# Patient Record
Sex: Male | Born: 1935 | Race: White | Hispanic: No | Smoking: Never smoker
Health system: Southern US, Community
[De-identification: ages and names within clinical notes are randomized; demographics above are authoritative.]

## PROBLEM LIST (undated history)

## (undated) DIAGNOSIS — I10 Essential (primary) hypertension: Secondary | ICD-10-CM

## (undated) DIAGNOSIS — K219 Gastro-esophageal reflux disease without esophagitis: Secondary | ICD-10-CM

## (undated) DIAGNOSIS — G629 Polyneuropathy, unspecified: Secondary | ICD-10-CM

## (undated) HISTORY — PX: CORONARY ARTERY BYPASS GRAFT: SHX141

---

## 2017-10-08 ENCOUNTER — Emergency Department (HOSPITAL_BASED_OUTPATIENT_CLINIC_OR_DEPARTMENT_OTHER)
Admission: EM | Admit: 2017-10-08 | Discharge: 2017-10-09 | Disposition: A | Discharge status: Other Acute Inpatient Hospital | Payer: Medicare Other | Attending: Emergency Medicine | Admitting: Emergency Medicine

## 2017-10-08 ENCOUNTER — Encounter (HOSPITAL_BASED_OUTPATIENT_CLINIC_OR_DEPARTMENT_OTHER): Payer: Self-pay | Admitting: Emergency Medicine

## 2017-10-08 ENCOUNTER — Emergency Department (HOSPITAL_BASED_OUTPATIENT_CLINIC_OR_DEPARTMENT_OTHER): Payer: Medicare Other

## 2017-10-08 ENCOUNTER — Other Ambulatory Visit: Payer: Self-pay

## 2017-10-08 DIAGNOSIS — Z7982 Long term (current) use of aspirin: Secondary | ICD-10-CM | POA: Diagnosis not present

## 2017-10-08 DIAGNOSIS — R21 Rash and other nonspecific skin eruption: Secondary | ICD-10-CM | POA: Diagnosis present

## 2017-10-08 DIAGNOSIS — I10 Essential (primary) hypertension: Secondary | ICD-10-CM | POA: Insufficient documentation

## 2017-10-08 DIAGNOSIS — N179 Acute kidney failure, unspecified: Secondary | ICD-10-CM | POA: Diagnosis not present

## 2017-10-08 DIAGNOSIS — E871 Hypo-osmolality and hyponatremia: Secondary | ICD-10-CM | POA: Insufficient documentation

## 2017-10-08 DIAGNOSIS — Z79899 Other long term (current) drug therapy: Secondary | ICD-10-CM | POA: Insufficient documentation

## 2017-10-08 DIAGNOSIS — L270 Generalized skin eruption due to drugs and medicaments taken internally: Secondary | ICD-10-CM | POA: Diagnosis not present

## 2017-10-08 HISTORY — DX: Essential (primary) hypertension: I10

## 2017-10-08 HISTORY — DX: Polyneuropathy, unspecified: G62.9

## 2017-10-08 HISTORY — DX: Gastro-esophageal reflux disease without esophagitis: K21.9

## 2017-10-08 LAB — CBC WITH DIFFERENTIAL/PLATELET
BASOS PCT: 0 %
Basophils Absolute: 0 10*3/uL (ref 0.0–0.1)
EOS ABS: 0.2 10*3/uL (ref 0.0–0.7)
Eosinophils Relative: 2 %
HEMATOCRIT: 31.4 % — AB (ref 39.0–52.0)
HEMOGLOBIN: 10.5 g/dL — AB (ref 13.0–17.0)
LYMPHS ABS: 0.5 10*3/uL — AB (ref 0.7–4.0)
Lymphocytes Relative: 7 %
MCH: 31.5 pg (ref 26.0–34.0)
MCHC: 33.4 g/dL (ref 30.0–36.0)
MCV: 94.3 fL (ref 78.0–100.0)
MONOS PCT: 3 %
Monocytes Absolute: 0.3 10*3/uL (ref 0.1–1.0)
NEUTROS ABS: 6.6 10*3/uL (ref 1.7–7.7)
NEUTROS PCT: 88 %
Platelets: 186 10*3/uL (ref 150–400)
RBC: 3.33 MIL/uL — AB (ref 4.22–5.81)
RDW: 14.2 % (ref 11.5–15.5)
WBC: 7.6 10*3/uL (ref 4.0–10.5)

## 2017-10-08 LAB — COMPREHENSIVE METABOLIC PANEL
ALT: 90 U/L — ABNORMAL HIGH (ref 17–63)
ANION GAP: 11 (ref 5–15)
AST: 150 U/L — ABNORMAL HIGH (ref 15–41)
Albumin: 3.7 g/dL (ref 3.5–5.0)
Alkaline Phosphatase: 32 U/L — ABNORMAL LOW (ref 38–126)
BUN: 42 mg/dL — ABNORMAL HIGH (ref 6–20)
CHLORIDE: 99 mmol/L — AB (ref 101–111)
CO2: 15 mmol/L — AB (ref 22–32)
CREATININE: 2.86 mg/dL — AB (ref 0.61–1.24)
Calcium: 8.3 mg/dL — ABNORMAL LOW (ref 8.9–10.3)
GFR, EST AFRICAN AMERICAN: 22 mL/min — AB (ref >60)
GFR, EST NON AFRICAN AMERICAN: 19 mL/min — AB (ref >60)
Glucose, Bld: 93 mg/dL (ref 65–99)
Potassium: 4.4 mmol/L (ref 3.5–5.1)
SODIUM: 125 mmol/L — AB (ref 135–145)
Total Bilirubin: 0.8 mg/dL (ref 0.3–1.2)
Total Protein: 6.8 g/dL (ref 6.5–8.1)

## 2017-10-08 LAB — I-STAT CG4 LACTIC ACID, ED: Lactic Acid, Venous: 1.4 mmol/L (ref 0.5–1.9)

## 2017-10-08 LAB — URINALYSIS, ROUTINE W REFLEX MICROSCOPIC
BILIRUBIN URINE: NEGATIVE
Glucose, UA: NEGATIVE mg/dL
KETONES UR: NEGATIVE mg/dL
LEUKOCYTES UA: NEGATIVE
NITRITE: NEGATIVE
PROTEIN: NEGATIVE mg/dL
Specific Gravity, Urine: 1.025 (ref 1.005–1.030)
pH: 5.5 (ref 5.0–8.0)

## 2017-10-08 LAB — URINALYSIS, MICROSCOPIC (REFLEX): WBC UA: NONE SEEN WBC/hpf (ref 0–5)

## 2017-10-08 LAB — PROTIME-INR
INR: 1.28
PROTHROMBIN TIME: 15.9 s — AB (ref 11.4–15.2)

## 2017-10-08 MED ORDER — SODIUM CHLORIDE 0.9 % IV BOLUS (SEPSIS)
500.0000 mL | Freq: Once | INTRAVENOUS | Status: AC
  Administered 2017-10-08: 500 mL via INTRAVENOUS

## 2017-10-08 MED ORDER — DIPHENHYDRAMINE HCL 50 MG/ML IJ SOLN
25.0000 mg | Freq: Once | INTRAMUSCULAR | Status: AC
Start: 2017-10-08 — End: 2017-10-08
  Administered 2017-10-08: 25 mg via INTRAVENOUS
  Filled 2017-10-08: qty 1

## 2017-10-08 MED ORDER — METHYLPREDNISOLONE SODIUM SUCC 125 MG IJ SOLR
125.0000 mg | Freq: Once | INTRAMUSCULAR | Status: AC
  Administered 2017-10-08: 125 mg via INTRAVENOUS
  Filled 2017-10-08: qty 2

## 2017-10-08 MED ORDER — SODIUM CHLORIDE 0.9 % IV SOLN
Freq: Once | INTRAVENOUS | Status: AC
  Administered 2017-10-08: 20:00:00 via INTRAVENOUS

## 2017-10-08 NOTE — ED Notes (Signed)
Per Dr. Donnald GarrePfeiffer, [facility] will not accept patient and she has [facility].  [facility]ed [facility] via [facility] line [phone] 23:03

## 2017-10-08 NOTE — ED Notes (Signed)
Paged supervisor for [facility] due to no response  21:50

## 2017-10-08 NOTE — ED Notes (Signed)
Per Dr. Donnald GarrePfeiffer [facility] - [facility]

## 2017-10-08 NOTE — ED Notes (Signed)
Paged [facility]ist at [facility] via [facility] per Dr Donnald GarrePfeiffer

## 2017-10-08 NOTE — ED Notes (Signed)
Paged [facility] @ [phone] per request from physician  21:23

## 2017-10-08 NOTE — ED Provider Notes (Signed)
[facility] EMERGENCY DEPARTMENT Provider Note   CSN: [id number] Arrival date & time: 10/08/17  1759     History   Chief Complaint Chief Complaint  Patient presents with  . Tremors    HPI Jonathon Garcia is a 83 y.o. male.  HPI Patient reports he got diagnosed with a urinary tract infection a week ago.  He is taken Bactrim for that.  He reports over the course of the week he has had very poor appetite and has not been able to eat or drink much.  Denies developing a fever.  Reports he has however started to get some chills and developed a rash.  No vomiting or diarrhea have developed.  Rash mostly feels warm and flushed.  He has not noted any blisters on his tongue lips or penis.  His daughter reports she went to take him his dinner this evening and he just seemed like he was getting worse and now has this rash so she brought him to the hospital. Past Medical History:  Diagnosis Date  . GERD (gastroesophageal reflux disease)   . Hypertension   . Peripheral neuropathy     There are no active problems to display for this patient.       Home Medications    Prior to Admission medications   Medication Sig Start Date End Date Taking? Authorizing Provider  amLODipine (NORVASC) 5 MG tablet Take 5 mg by mouth daily.   Yes [provider]  aspirin 325 MG tablet Take 325 mg by mouth daily.   Yes [provider]  atorvastatin (LIPITOR) 80 MG tablet Take 80 mg by mouth daily.   Yes [provider]  fenofibrate 160 MG tablet Take 160 mg by mouth daily.   Yes [provider]  finasteride (PROSCAR) 5 MG tablet Take 5 mg by mouth daily.   Yes [provider]  gabapentin (NEURONTIN) 600 MG tablet Take 600 mg by mouth 3 (three) times daily.   Yes [provider]  hydrochlorothiazide (HYDRODIURIL) 25 MG tablet Take 25 mg by mouth daily.   Yes [provider]  omeprazole (PRILOSEC) 20 MG capsule Take 20 mg by mouth daily.    Yes [provider]  ramipril (ALTACE) 10 MG capsule Take 10 mg by mouth daily.   Yes [provider]  sulfamethoxazole-trimethoprim (BACTRIM DS,SEPTRA DS) 800-160 MG tablet Take 1 tablet by mouth 2 (two) times daily.   Yes [provider]  tamsulosin (FLOMAX) 0.4 MG CAPS capsule Take 0.4 mg by mouth.   Yes [provider]    Family History No family history on file.  Social History Social History   Tobacco Use  . Smoking status: Never Smoker  . Smokeless tobacco: Never Used  Substance Use Topics  . Alcohol use: No    Frequency: Never  . Drug use: No     Allergies   Patient has no known allergies.   Review of Systems Review of Systems 10 Systems reviewed and are negative for acute change except as noted in the HPI.   Physical Exam Updated Vital Signs BP (!) 110/54   Pulse 73   Temp 100 F (37.8 C) (Oral)   Resp 20   Ht 5\' 7"  (1.702 m)   Wt 64.9 kg (143 lb)   SpO2 94%   BMI 22.40 kg/m   Physical Exam  Constitutional: He is oriented to person, place, and time. He appears well-developed and well-nourished.  Patient is alert and interactive.  No respiratory distress.  HENT:  Head: Normocephalic and atraumatic.  Nose: Nose normal.  Mouth/Throat: Oropharynx is clear and moist.  Posterior oropharynx widely patent.  Mucous membranes pink and moist.  No ulcerative lesions on mucous membranes.  Eyes: Conjunctivae and EOM are normal. Pupils are equal, round, and reactive to light.  Neck: Neck supple.  Cardiovascular: Normal rate, regular rhythm, normal heart sounds and intact distal pulses.  No murmur heard. Pulmonary/Chest: Effort normal and breath sounds normal. No respiratory distress.  Abdominal: Soft. He exhibits no distension and no mass. There is no tenderness. There is no guarding.  Genitourinary:  Genitourinary Comments: No ulcerations on penile meatus.  Musculoskeletal: Normal range of motion. He exhibits no edema or  tenderness.  Neurological: He is alert and oriented to person, place, and time. No cranial nerve deficit. He exhibits normal muscle tone. Coordination normal.  Patient has some tremor of the hands with intention movements.  Slight pill-rolling motion (question possible underlying parkinsonian tremor versus other more acute etiology)  Skin: Skin is warm and dry.  Psychiatric: He has a normal mood and affect.  Nursing note and vitals reviewed. \          ED Treatments / Results  Labs (all labs ordered are listed, but only abnormal results are displayed) Labs Reviewed  COMPREHENSIVE METABOLIC PANEL - Abnormal; Notable for the following components:      Result Value   Sodium 125 (*)    Chloride 99 (*)    CO2 15 (*)    BUN 42 (*)    Creatinine, Ser 2.86 (*)    Calcium 8.3 (*)    AST 150 (*)    ALT 90 (*)    Alkaline Phosphatase 32 (*)    GFR calc non Af Amer 19 (*)    GFR calc Af Amer 22 (*)    All other components within normal limits  CBC WITH DIFFERENTIAL/PLATELET - Abnormal; Notable for the following components:   RBC 3.33 (*)    Hemoglobin 10.5 (*)    HCT 31.4 (*)    Lymphs Abs 0.5 (*)    All other components within normal limits  PROTIME-INR - Abnormal; Notable for the following components:   Prothrombin Time 15.9 (*)    All other components within normal limits  URINALYSIS, ROUTINE W REFLEX MICROSCOPIC - Abnormal; Notable for the following components:   Hgb urine dipstick MODERATE (*)    All other components within normal limits  URINALYSIS, MICROSCOPIC (REFLEX) - Abnormal; Notable for the following components:   Bacteria, UA RARE (*)    Squamous Epithelial / LPF 0-5 (*)    All other components within normal limits  URINE CULTURE  CULTURE, BLOOD (ROUTINE X 2)  CULTURE, BLOOD (ROUTINE X 2)  I-STAT CG4 LACTIC ACID, ED    EKG  EKG Interpretation None       Radiology Dg Chest 2 View  Result Date: 10/08/2017 CLINICAL DATA:  Weakness EXAM: CHEST  2 VIEW  COMPARISON:  None. FINDINGS: Cardiac shadow is mildly enlarged. Aortic calcifications are seen. Changes of prior median sternotomy are noted. The lungs are well aerated bilaterally. Calcified granuloma is noted in the left lung base. Visualized upper abdomen demonstrates scattered large small bowel gas. No acute bony abnormality is seen. IMPRESSION: Changes of prior granulomatous disease.  No acute abnormality seen. Electronically Signed   By: Alcide CleverMark  Lukens M.D.   On: 10/08/2017 20:02    Procedures Procedures (including critical care time)  Medications Ordered in  ED Medications  famotidine (PEPCID) IVPB 20 mg premix (not administered)  diphenhydrAMINE (BENADRYL) injection 25 mg (not administered)  methylPREDNISolone sodium succinate (SOLU-MEDROL) 125 mg/2 mL injection 125 mg (not administered)  sodium chloride 0.9 % bolus 500 mL (0 mLs Intravenous Stopped 10/08/17 2032)  0.9 %  sodium chloride infusion ( Intravenous New Bag/Given 10/08/17 2002)  methylPREDNISolone sodium succinate (SOLU-MEDROL) 125 mg/2 mL injection 125 mg (125 mg Intravenous Given 10/08/17 2030)  diphenhydrAMINE (BENADRYL) injection 25 mg (25 mg Intravenous Given 10/08/17 2029)  sodium chloride 0.9 % bolus 500 mL (0 mLs Intravenous Stopped 10/08/17 2236)     Initial Impression / Assessment and Plan / ED Course  I have reviewed the triage vital signs and the nursing notes.  Pertinent labs & imaging results that were available during my care of the patient were reviewed by me and considered in my medical decision making (see chart for details).    Consult: Reviewed with Dr. Erskine SquibbKonbrebbi at [facility]. No bed availability at facility.   Consult: Reviewed with Dr. Antionette Charpyd [facility] hospitalist.  He felt that the patient could not be [facility] due to possibility of Stevens-Johnson's and the patient was more appropriate [facility].  Consult: Discussed with Dr. Ninfa LindenAhmad, hospitalist at [facility]. He advised  they have [facility] open beds [facility] until possibly sometime tomorrow.  Reviewed the case and suggested at this time patient does not have active Stevens-Johnson's without mucosal lesions and appropriate management will be admission at facility and subsequent transfer if patient's condition worsened and became consistent with active Stevens-Johnson's.  Consult: Re-contacted Dr. Antionette Charpyd for [facility] as there was bed [facility].  He advised he refused to accept the patient.  Refused stating there is no available consultations for dermatology for rash and if the patient progressed to Stevens-Johnson syndrome, it would be too difficult for the service to manage. I tried to impress upon him that the patient would indefinitely [facility] and that for patient comfort and safety, best management would be inpatient [facility] with subsequent transfer if his condition deteriorated. He did not agree and expressed personal frustration with my firm belief and reiteration that best management was admission to hospitalist service. He maintained refusal to admit and ended the conversation abruptly by disconnecting the line.  Consult: Reviewed with Dr.Firas Rabbat, hospitalist at [facility].  We reviewed the case.  He accepts the patient for admission.  He does however advised there may be no beds available until after 12 PM tomorrow.  Agrees with current management with continuation of fluids, Solu-Medrol and Benadryl.  No other empiric antibiotics at this time based on her current lack of focus of infection.  Final Clinical Impressions(s) / ED Diagnoses   Final diagnoses:  Hyponatremia  Acute renal failure, unspecified acute renal failure type (HCC)  Rash, drug   Presents as outlined above.  Treatment initiated in the emergency department with IV fluid hydration Solu-Medrol and Benadryl.  Patient showed improvement with hydration and medications.  Facial rash is  diminished.  Patient has no respiratory distress.  As per outlined above ultimate plan is for [facility] however patient will have prolonged ED stay while awaiting bed availability.  Treatment will be to continue normal saline for hyponatremia and AK I.  Continue scheduled Solu-Medrol, Benadryl and Pepcid.  Continue to observe for either improvement or worsening of rash and any change in disposition as needed.  Dr. Judd Lien will observe the patient overnight.  ED Discharge Orders    None       Arby Barrette, MD 10/09/17 [phone]

## 2017-10-08 NOTE — ED Triage Notes (Signed)
Patient states that he was put on sulfa last week for a possible UTI - today he started to have shakes and tremors "chils" with a rash

## 2017-10-08 NOTE — ED Notes (Signed)
Called [facility] line [phone].  Spoke with Albin FellingCarla who advised she would page hospitalist for [facility]

## 2017-10-09 DIAGNOSIS — E871 Hypo-osmolality and hyponatremia: Secondary | ICD-10-CM | POA: Diagnosis not present

## 2017-10-09 MED ORDER — FAMOTIDINE IN NACL 20-0.9 MG/50ML-% IV SOLN
20.0000 mg | Freq: Two times a day (BID) | INTRAVENOUS | Status: DC
  Administered 2017-10-09: 20 mg via INTRAVENOUS
  Filled 2017-10-09: qty 50

## 2017-10-09 MED ORDER — DIPHENHYDRAMINE HCL 50 MG/ML IJ SOLN: 25.0000 mg | Freq: Four times a day (QID) | INTRAMUSCULAR | Status: DC | PRN

## 2017-10-09 MED ORDER — SODIUM CHLORIDE 0.9 % IV SOLN
Freq: Once | INTRAVENOUS | Status: AC
  Administered 2017-10-09: 1000 mL via INTRAVENOUS

## 2017-10-09 MED ORDER — METHYLPREDNISOLONE SODIUM SUCC 125 MG IJ SOLR
125.0000 mg | Freq: Three times a day (TID) | INTRAMUSCULAR | Status: DC
  Administered 2017-10-09: 125 mg via INTRAVENOUS
  Filled 2017-10-09: qty 2

## 2017-10-09 NOTE — ED Notes (Signed)
[facility] but [facility] may take a few hours per nurse

## 2017-10-09 NOTE — ED Notes (Signed)
[facility]. Donnald GarrePfeiffer @ [phone]

## 2017-10-09 NOTE — ED Notes (Signed)
Pt's daughter's contact information : [phone]

## 2017-10-10 LAB — URINE CULTURE: Culture: NO GROWTH

## 2017-10-13 LAB — CULTURE, BLOOD (ROUTINE X 2)
CULTURE: NO GROWTH
Culture: NO GROWTH
SPECIAL REQUESTS: ADEQUATE
Special Requests: ADEQUATE

## 2018-06-26 IMAGING — CR DG CHEST 2V
2 series · 2 of 2 positions shown · non-contrast
Comparison: None.

CLINICAL DATA: Weakness

EXAM:
CHEST  2 VIEW

[w chest ap]
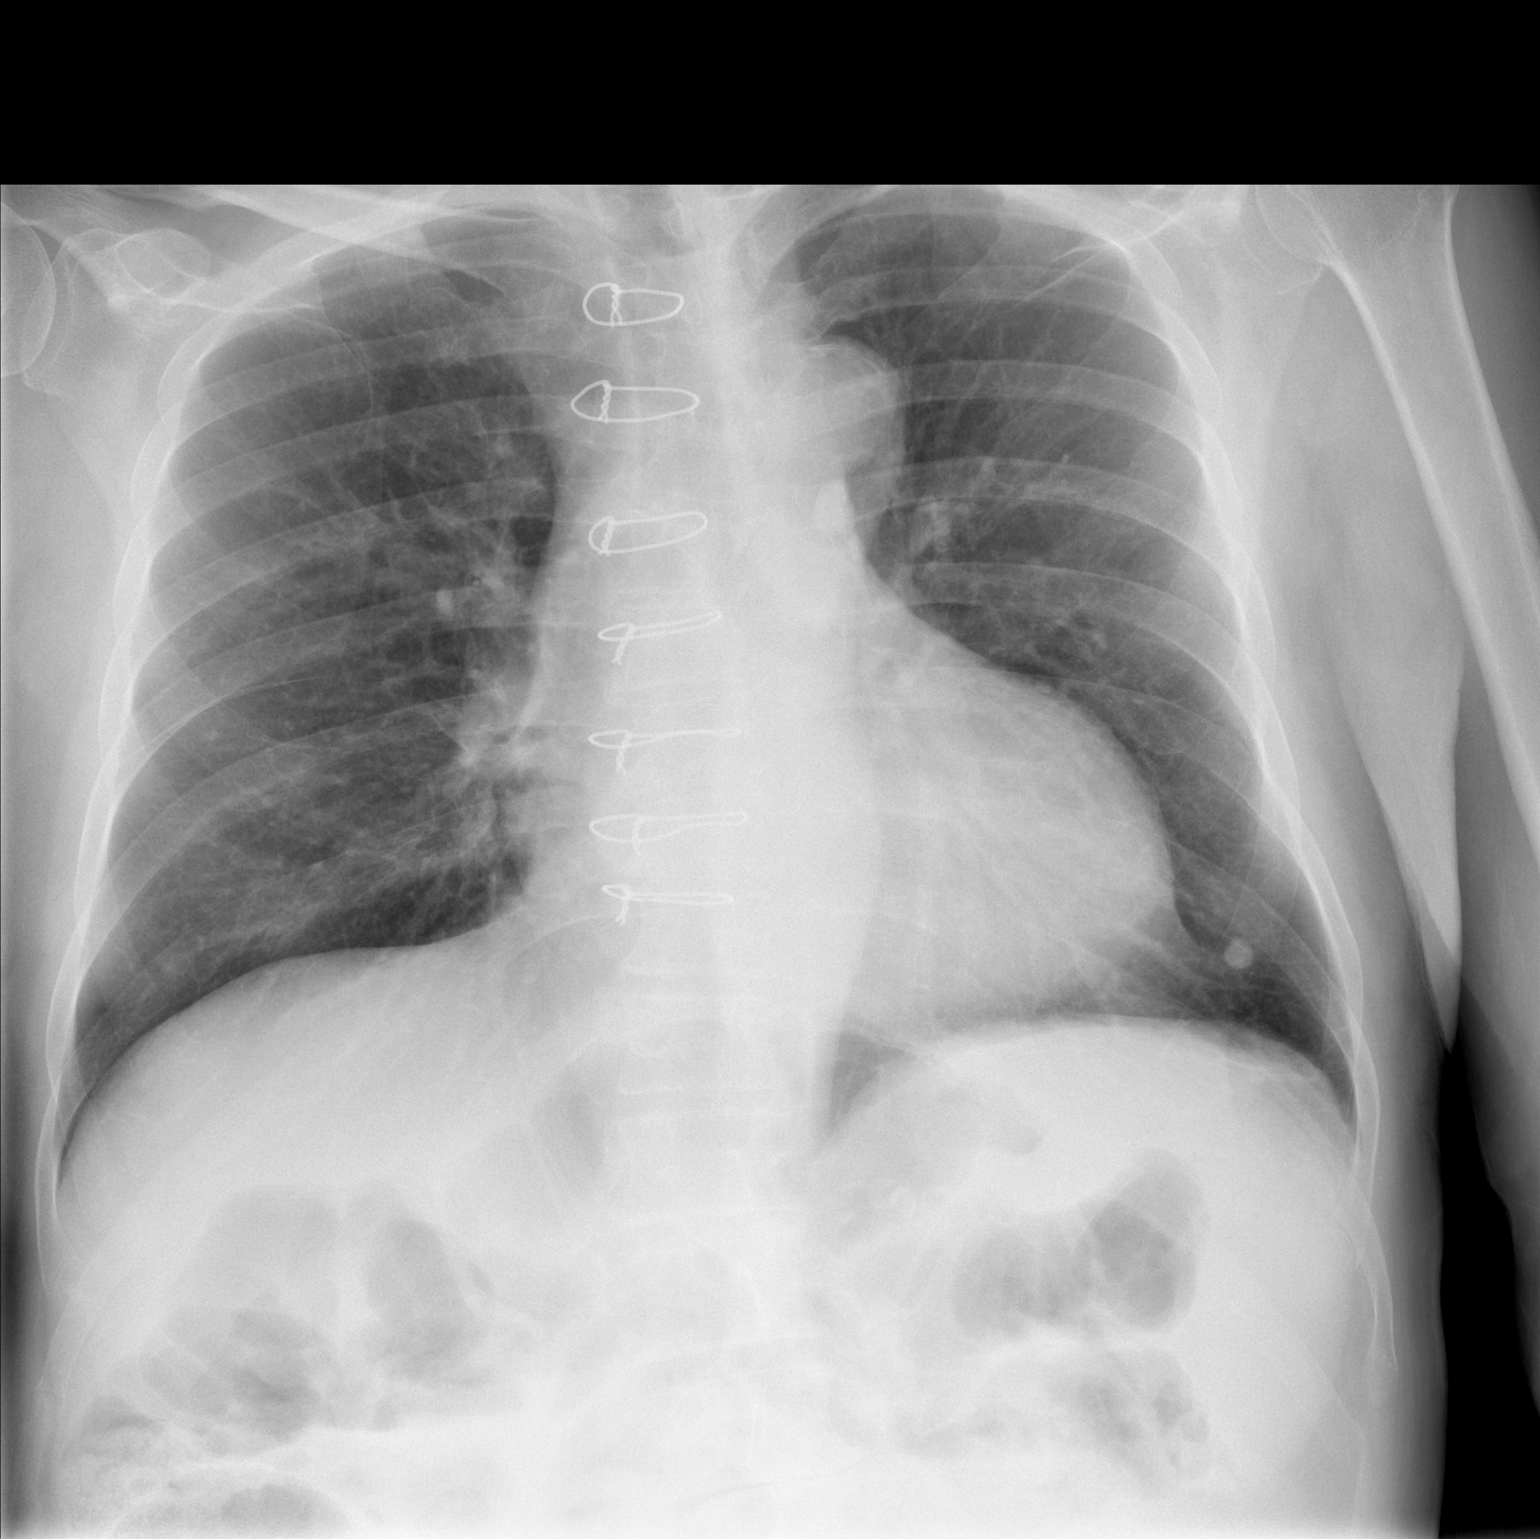

[w chest lat]
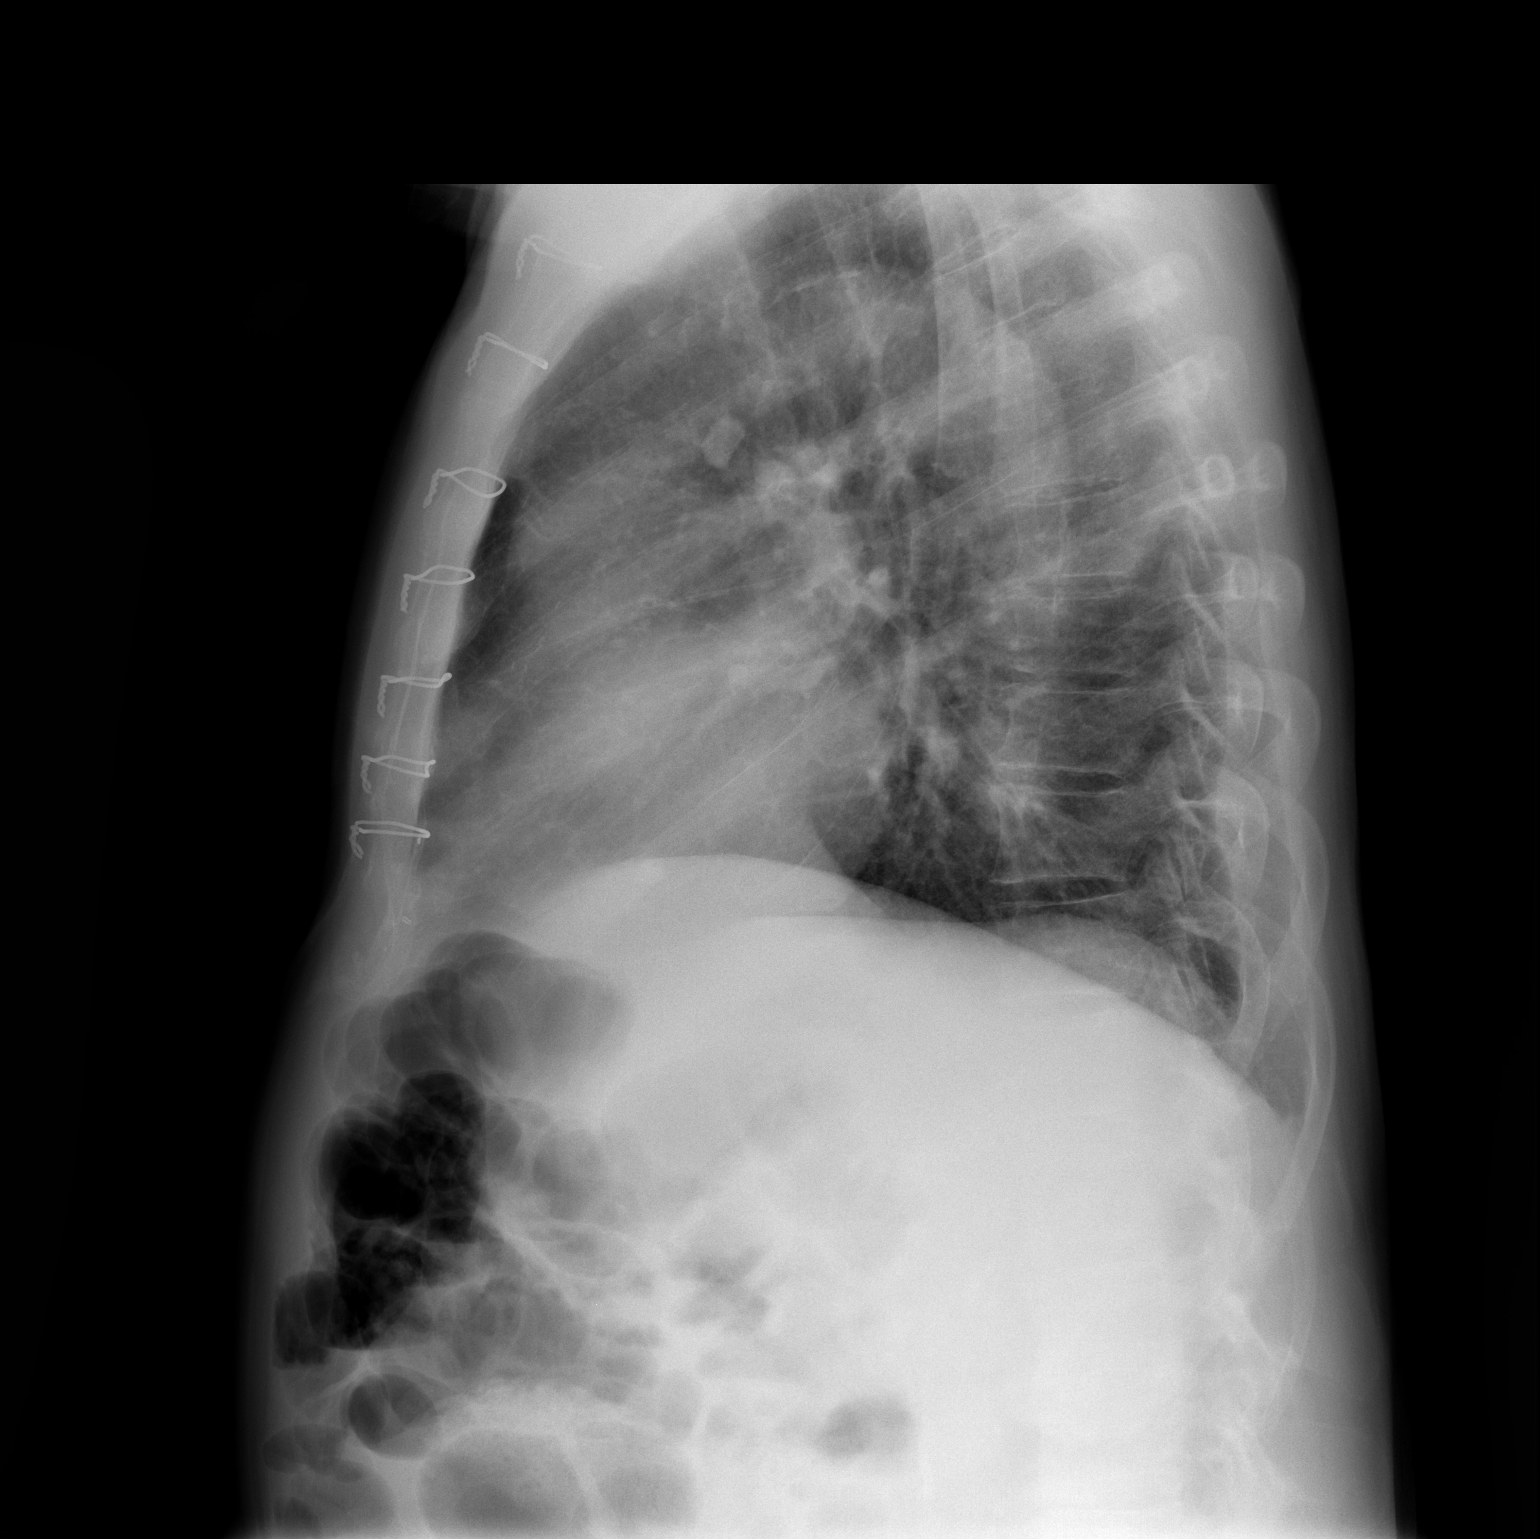

[2 of 2 positions shown; findings below may reference images not displayed]

FINDINGS: Cardiac shadow is mildly enlarged. Aortic calcifications are seen.
Changes of prior median sternotomy are noted. The lungs are well
aerated bilaterally. Calcified granuloma is noted in the left lung
base. Visualized upper abdomen demonstrates scattered large small
bowel gas. No acute bony abnormality is seen.
IMPRESSION: Changes of prior granulomatous disease.  No acute abnormality seen.

## 2019-11-18 ENCOUNTER — Ambulatory Visit: Payer: Medicare Other | Attending: Internal Medicine

## 2019-11-18 DIAGNOSIS — Z23 Encounter for immunization: Secondary | ICD-10-CM | POA: Insufficient documentation

## 2019-11-18 NOTE — Progress Notes (Signed)
   Covid-19 Vaccination Clinic  Name:  Jonathon Garcia    MRN: [id number] DOB: [birth year]  11/18/2019  [name] was observed post Covid-19 immunization for 15 minutes without incidence. He was provided with Vaccine Information Sheet and instruction to access the V-Safe system.   [name] was instructed to call 911 with any severe reactions post vaccine: Marland Kitchen Difficulty breathing  . Swelling of your face and throat  . A fast heartbeat  . A bad rash all over your body  . Dizziness and weakness    Immunizations Administered    Name Date Dose VIS Date Route   Pfizer COVID-19 Vaccine 11/18/2019  8:50 AM 0.3 mL 10/16/2019 Intramuscular   Manufacturer: [employer], [employer]   Lot: [id number]   NDC: [address]-5

## 2019-12-08 ENCOUNTER — Ambulatory Visit: Payer: Medicare Other | Attending: Internal Medicine

## 2019-12-08 DIAGNOSIS — Z23 Encounter for immunization: Secondary | ICD-10-CM

## 2019-12-08 NOTE — Progress Notes (Signed)
   Covid-19 Vaccination Clinic  Name:  Jonathon Garcia    MRN: [id number] DOB: Jan 03, 1932  12/08/2019  [name] was observed post Covid-19 immunization for 15 minutes without incidence. He was provided with Vaccine Information Sheet and instruction to access the V-Safe system.   [name] was instructed to call 911 with any severe reactions post vaccine: Marland Kitchen Difficulty breathing  . Swelling of your face and throat  . A fast heartbeat  . A bad rash all over your body  . Dizziness and weakness    Immunizations Administered    Name Date Dose VIS Date Route   Pfizer COVID-19 Vaccine 12/08/2019  8:16 AM 0.3 mL 10/16/2019 Intramuscular   Manufacturer: [employer], [employer]   Lot: [id number]   NDC: [address]-1

## 2021-09-05 DEATH — deceased
